# Patient Record
Sex: Male | Born: 1980 | Hispanic: No | Marital: Married | Smoking: Never smoker
Health system: Southern US, Community
[De-identification: ages and names within clinical notes are randomized; demographics above are authoritative.]

## PROBLEM LIST (undated history)

## (undated) HISTORY — PX: HERNIA REPAIR: SHX51

---

## 2022-04-13 ENCOUNTER — Emergency Department (HOSPITAL_BASED_OUTPATIENT_CLINIC_OR_DEPARTMENT_OTHER)
Admission: EM | Admit: 2022-04-13 | Discharge: 2022-04-14 | Disposition: A | Discharge status: Home / Self Care | Payer: BLUE CROSS/BLUE SHIELD | Attending: Emergency Medicine | Admitting: Emergency Medicine

## 2022-04-13 ENCOUNTER — Encounter (HOSPITAL_BASED_OUTPATIENT_CLINIC_OR_DEPARTMENT_OTHER): Payer: Self-pay | Admitting: Urology

## 2022-04-13 ENCOUNTER — Other Ambulatory Visit: Payer: Self-pay

## 2022-04-13 DIAGNOSIS — R202 Paresthesia of skin: Secondary | ICD-10-CM | POA: Diagnosis not present

## 2022-04-13 DIAGNOSIS — R1013 Epigastric pain: Secondary | ICD-10-CM | POA: Insufficient documentation

## 2022-04-13 DIAGNOSIS — G8929 Other chronic pain: Secondary | ICD-10-CM

## 2022-04-13 DIAGNOSIS — R200 Anesthesia of skin: Secondary | ICD-10-CM

## 2022-04-13 DIAGNOSIS — K219 Gastro-esophageal reflux disease without esophagitis: Secondary | ICD-10-CM | POA: Insufficient documentation

## 2022-04-13 LAB — CBG MONITORING, ED: Glucose-Capillary: 143 mg/dL — ABNORMAL HIGH (ref 70–99)

## 2022-04-13 NOTE — ED Triage Notes (Signed)
Pt states left side numbness and dizziness x 2 days, states vision blurry as well  Left sided abdominal pain radiating to back   VAN neg, BEFAST neg

## 2022-04-14 ENCOUNTER — Emergency Department (HOSPITAL_BASED_OUTPATIENT_CLINIC_OR_DEPARTMENT_OTHER): Payer: BLUE CROSS/BLUE SHIELD

## 2022-04-14 LAB — HEPATIC FUNCTION PANEL
ALT: 31 U/L (ref 0–44)
AST: 22 U/L (ref 15–41)
Albumin: 4.4 g/dL (ref 3.5–5.0)
Alkaline Phosphatase: 102 U/L (ref 38–126)
Bilirubin, Direct: <0.1 mg/dL (ref 0.0–0.2)
Total Bilirubin: 0.6 mg/dL (ref 0.3–1.2)
Total Protein: 7.7 g/dL (ref 6.5–8.1)

## 2022-04-14 LAB — BASIC METABOLIC PANEL
Anion gap: 9 (ref 5–15)
BUN: 15 mg/dL (ref 6–20)
CO2: 28 mmol/L (ref 22–32)
Calcium: 9.6 mg/dL (ref 8.9–10.3)
Chloride: 100 mmol/L (ref 98–111)
Creatinine, Ser: 0.92 mg/dL (ref 0.61–1.24)
GFR, Estimated: >60 mL/min (ref >60)
Glucose, Bld: 116 mg/dL — ABNORMAL HIGH (ref 70–99)
Potassium: 4.5 mmol/L (ref 3.5–5.1)
Sodium: 137 mmol/L (ref 135–145)

## 2022-04-14 LAB — CBC
HCT: 47.1 % (ref 39.0–52.0)
Hemoglobin: 15.7 g/dL (ref 13.0–17.0)
MCH: 27.4 pg (ref 26.0–34.0)
MCHC: 33.3 g/dL (ref 30.0–36.0)
MCV: 82.2 fL (ref 80.0–100.0)
Platelets: 222 10*3/uL (ref 150–400)
RBC: 5.73 MIL/uL (ref 4.22–5.81)
RDW: 13.1 % (ref 11.5–15.5)
WBC: 8.8 10*3/uL (ref 4.0–10.5)
nRBC: 0 % (ref 0.0–0.2)

## 2022-04-14 LAB — URINALYSIS, ROUTINE W REFLEX MICROSCOPIC
Bilirubin Urine: NEGATIVE
Glucose, UA: NEGATIVE mg/dL
Ketones, ur: NEGATIVE mg/dL
Leukocytes,Ua: NEGATIVE
Nitrite: NEGATIVE
Protein, ur: NEGATIVE mg/dL
Specific Gravity, Urine: 1.01 (ref 1.005–1.030)
pH: 6 (ref 5.0–8.0)

## 2022-04-14 LAB — URINALYSIS, MICROSCOPIC (REFLEX)

## 2022-04-14 LAB — LIPASE, BLOOD: Lipase: 34 U/L (ref 11–51)

## 2022-04-14 LAB — TROPONIN I (HIGH SENSITIVITY)
Troponin I (High Sensitivity): <2 ng/L (ref <18)
Troponin I (High Sensitivity): <2 ng/L (ref <18)

## 2022-04-14 NOTE — ED Notes (Signed)
Pt is going to [facility] for an MRI, going POV, [facility] is accepting.

## 2022-04-14 NOTE — ED Notes (Signed)
Called [facility] to see if patient has arrived at facility as they are still showing "OTF" at [facility], states patient is not there. This RN attempted to call patient, phone continues to ring, no answer. Unable to leave voicemail as phone would continue to ring and would not go to voicemail.

## 2022-04-14 NOTE — Discharge Instructions (Signed)
Please go to the [facility] emergency department.  Check in at the front desk and tell them you are here as a transfer from this facility to receive an MRI.  They may have the wait in the waiting room but will do this scan from the waiting room.

## 2022-04-14 NOTE — ED Notes (Addendum)
Discharging patient from system as they are no longer at [facility]. Unable to obtain pain score at time of discharge in chart

## 2022-04-14 NOTE — ED Notes (Signed)
Pt gone to [place] er

## 2022-04-14 NOTE — ED Provider Notes (Signed)
Emergency Department Provider Note   I have reviewed the triage vital signs and the nursing notes.   HISTORY  Chief Complaint Dizziness and Numbness   HPI Eddie Turner is a 41 y.o. male with past medical history reviewed below including ongoing epigastric abdominal pain with GERD and history of Nissen fundoplication presents emergency department with new left side numbness.  Patient states 2 days ago he developed numbness to the left face and arm.  Occasionally has numbness/tingling into the left leg.  No appreciable weakness.  No headaches or vision change.  He continues to have pain in his epigastric region which is been present for several months.  No vomiting.  He is followed in the [facility] system with plan for surgical revision later this month.  No fevers.  No new or suddenly worsening pain symptoms.  History reviewed. No pertinent past medical history.  Review of Systems  Constitutional: No fever/chills Eyes: No visual changes. ENT: No sore throat. Cardiovascular: Denies chest pain. Respiratory: Denies shortness of breath. Gastrointestinal: Positive epigastric abdominal pain.  No nausea, no vomiting.  No diarrhea.  No constipation. Genitourinary: Negative for dysuria. Musculoskeletal: Negative for back pain. Skin: Negative for rash. Neurological: Negative for headaches, focal weakness. Positive left face and arm numbness.    ____________________________________________   PHYSICAL EXAM:  VITAL SIGNS: ED Triage Vitals  Enc Vitals Group     BP 04/13/22 2337 115/76     Pulse Rate 04/13/22 2337 82     Resp 04/13/22 2337 20     Temp 04/13/22 2337 98.2 F (36.8 C)     Temp Source 04/13/22 2337 Oral     SpO2 04/13/22 2337 100 %     Weight 04/13/22 2337 207 lb (93.9 kg)     Height 04/13/22 2337 5\' 5"  (1.651 m)   Constitutional: Alert and oriented. Well appearing and in no acute distress. Eyes: Conjunctivae are normal.  Head: Atraumatic. Nose: No  congestion/rhinnorhea. Mouth/Throat: Mucous membranes are moist.   Neck: No stridor.  Cardiovascular: Normal rate, regular rhythm. Good peripheral circulation. Grossly normal heart sounds.   Respiratory: Normal respiratory effort.  No retractions. Lungs CTAB. Gastrointestinal: Soft and nontender. No distention.  Musculoskeletal: No lower extremity tenderness nor edema. No gross deformities of extremities. Neurologic:  Normal speech and language.  No appreciable facial droop or extremity weakness.  Subjective sensory decreased to the left upper extremity and left face.  Normal sensation in the lower extremities.  Skin:  Skin is warm, dry and intact. No rash noted. ____________________________________________   LABS (all labs ordered are listed, but only abnormal results are displayed)  Labs Reviewed  BASIC METABOLIC PANEL - Abnormal; Notable for the following components:      Result Value   Glucose, Bld 116 (*)    All other components within normal limits  URINALYSIS, ROUTINE W REFLEX MICROSCOPIC - Abnormal; Notable for the following components:   Hgb urine dipstick TRACE (*)    All other components within normal limits  URINALYSIS, MICROSCOPIC (REFLEX) - Abnormal; Notable for the following components:   Bacteria, UA RARE (*)    All other components within normal limits  CBG MONITORING, ED - Abnormal; Notable for the following components:   Glucose-Capillary 143 (*)    All other components within normal limits  CBC  HEPATIC FUNCTION PANEL  LIPASE, BLOOD  TROPONIN I (HIGH SENSITIVITY)  TROPONIN I (HIGH SENSITIVITY)   ____________________________________________  EKG   EKG Interpretation  Date/Time:  Wednesday Apr 13 2022  23:40:53 EDT Ventricular Rate:  78 PR Interval:  162 QRS Duration: 80 QT Interval:  364 QTC Calculation: 414 R Axis:   4 Text Interpretation: Normal sinus rhythm Nonspecific T wave abnormality Abnormal ECG No previous ECGs available Confirmed by Nanda Quinton [phone]) on 04/13/2022 11:52:54 PM        ____________________________________________  RADIOLOGY  DG Chest 2 View  Result Date: 04/14/2022 CLINICAL DATA:  Chest pain EXAM: CHEST - 2 VIEW COMPARISON:  01/26/2022 FINDINGS: The heart size and mediastinal contours are within normal limits. Both lungs are clear. The visualized skeletal structures are unremarkable. IMPRESSION: Normal study. Electronically Signed   By: Rolm Baptise M.D.   On: 04/14/2022 00:26   CT Head Wo Contrast  Result Date: 04/14/2022 CLINICAL DATA:  Left-sided numbness, dizziness, blurred vision EXAM: CT HEAD WITHOUT CONTRAST TECHNIQUE: Contiguous axial images were obtained from the base of the skull through the vertex without intravenous contrast. RADIATION DOSE REDUCTION: This exam was performed according to the departmental dose-optimization program which includes automated exposure control, adjustment of the mA and/or kV according to patient size and/or use of iterative reconstruction technique. COMPARISON:  None Available. FINDINGS: Brain: No evidence of acute infarction, hemorrhage, hydrocephalus, extra-axial collection or mass lesion/mass effect. Vascular: No hyperdense vessel or unexpected calcification. Skull: Normal. Negative for fracture or focal lesion. Sinuses/Orbits: The visualized paranasal sinuses are essentially clear. The mastoid air cells are unopacified. Other: None. IMPRESSION: Normal head CT. Electronically Signed   By: Julian Hy M.D.   On: 04/14/2022 00:18    ____________________________________________   PROCEDURES  Procedure(s) performed:   Procedures  None  ____________________________________________   INITIAL IMPRESSION / ASSESSMENT AND PLAN / ED COURSE  Pertinent labs & imaging results that were available during my care of the patient were reviewed by me and considered in my medical decision making (see chart for details).   This patient is Presenting for Evaluation of  numbness, which does require a range of treatment options, and is a complaint that involves a high risk of morbidity and mortality.  The Differential Diagnoses include CVA, TIA, electrolyte disturbance, neuropathy.   I decided to review pertinent External Data, and in summary patient followed with the [facility] system for abdominal pain with last CT abdomen/pelvis on 5/11.   Clinical Laboratory Tests Ordered, included troponin is within normal limits.  No leukocytosis.  LFTs and bilirubin are normal.  No leukocytosis.  Lipase negative.  Radiologic Tests Ordered, included CXR and CT head. I independently interpreted the images and agree with radiology interpretation.   Cardiac Monitor Tracing which shows NSR.   Social Determinants of Health Risk no smoking history.    Medical Decision Making: Summary:  Patient presents to the emergency department for evaluation of ongoing abdominal pain which is not new or different.  Had a recent CT scan in the [facility] system and so we will defer work-up further.  His abdominal exam here for me is reassuring and vital signs are within normal limits.  I am concerned regarding the 2 days of numbness to the left face and arm.  No neck or back pain.  No headache to suspect complex migraine.  CT imaging of the head is reassuring.  Discussed transfer to [facility] for MRI this evening and patient is in agreement.  Dr. Matilde Sprang is accepting.   Reevaluation with update and discussion with patient in agreement with plan for transfer.    Disposition: transfer to [facility].   ____________________________________________  FINAL CLINICAL  IMPRESSION(S) / ED DIAGNOSES  Final diagnoses:  Numbness  Chronic abdominal pain     Note:  This document was prepared using Dragon voice recognition software and may include unintentional dictation errors.  Nanda Quinton, MD, [facility] Emergency Medicine    Leland Staszewski, [facility], MD 04/14/22 [phone]

## 2023-07-02 IMAGING — DX DG CHEST 2V
2 series · 2 of 2 positions shown · non-contrast
Comparison: 01/26/2022

CLINICAL DATA: Chest pain

EXAM:
CHEST - 2 VIEW

[chest lat]
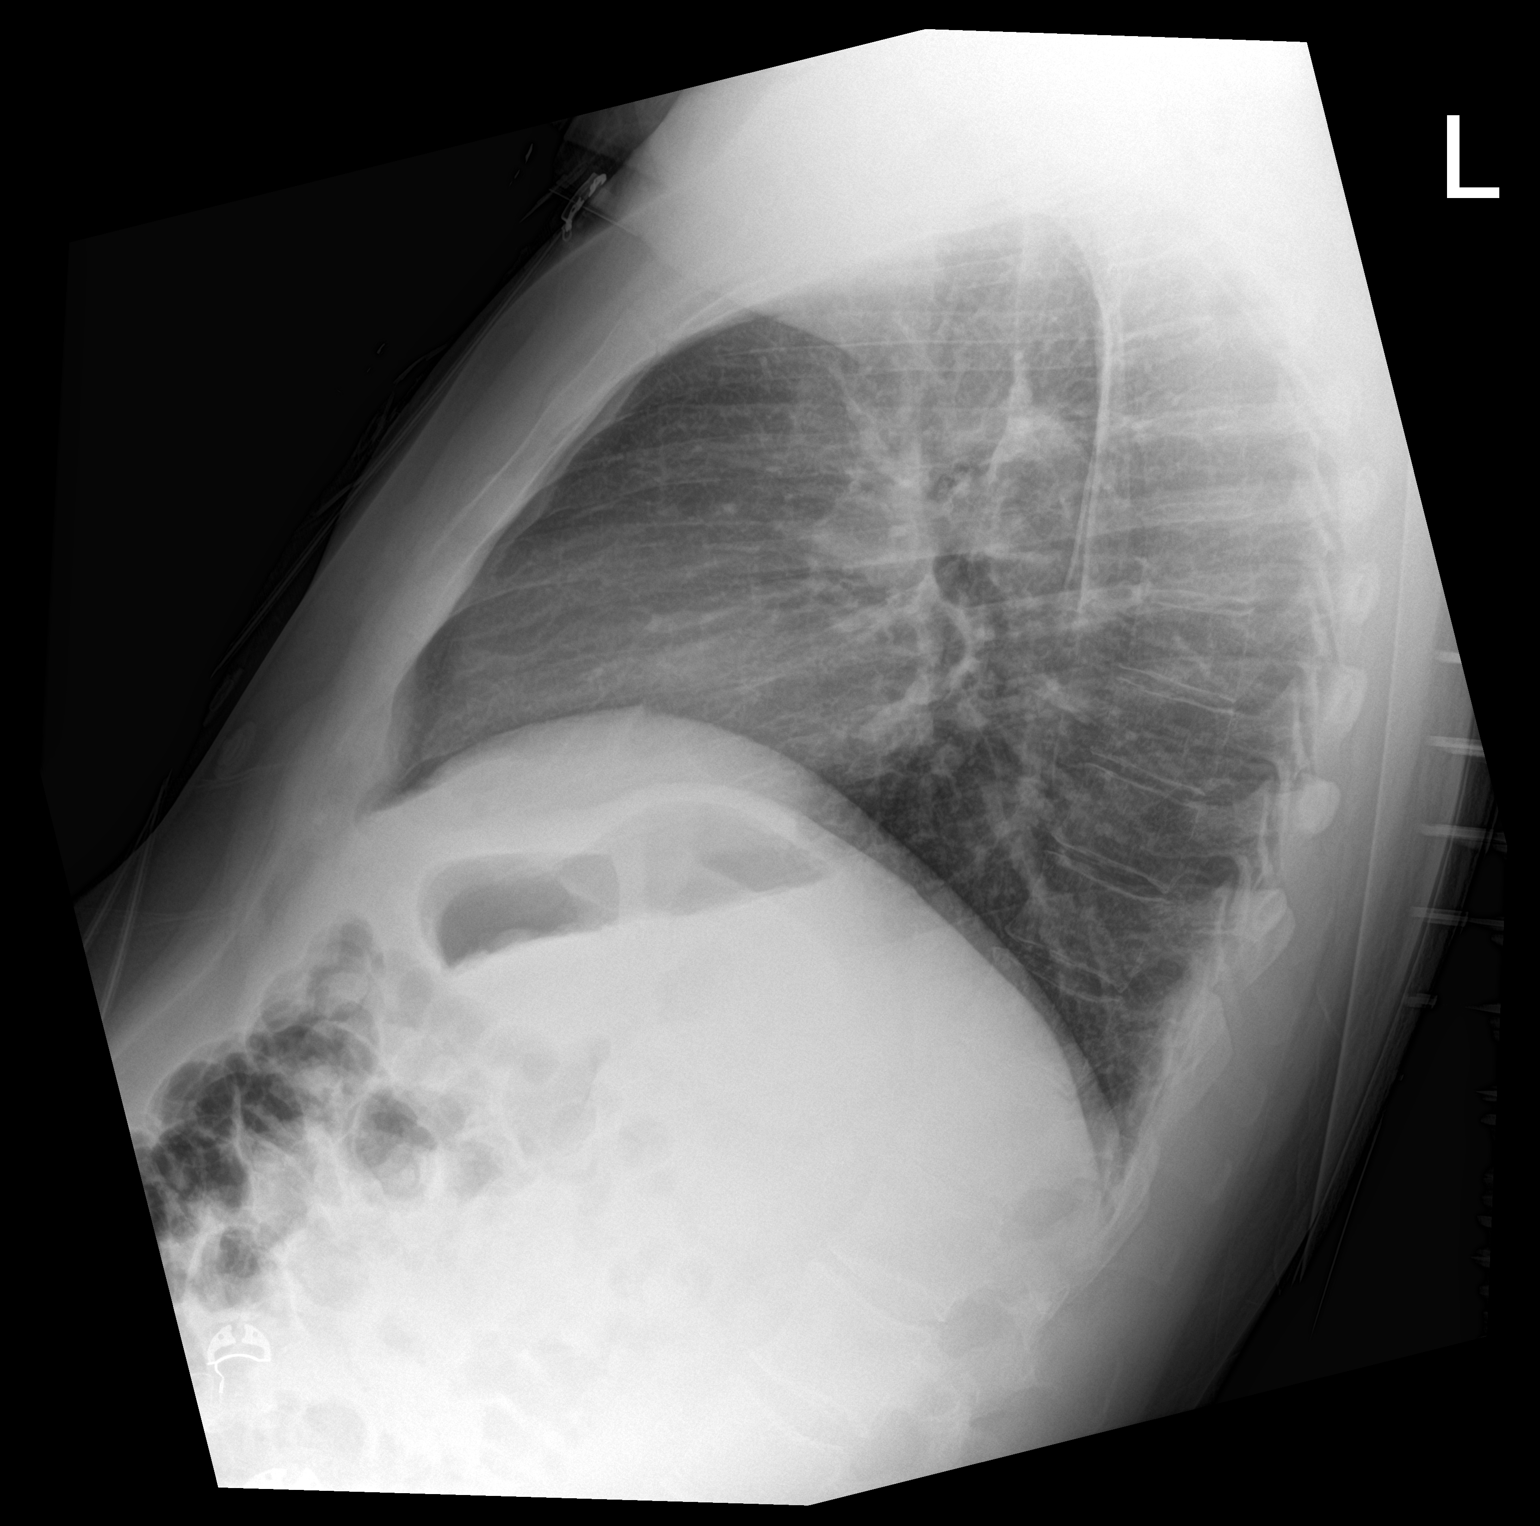

[chest ap strecther]
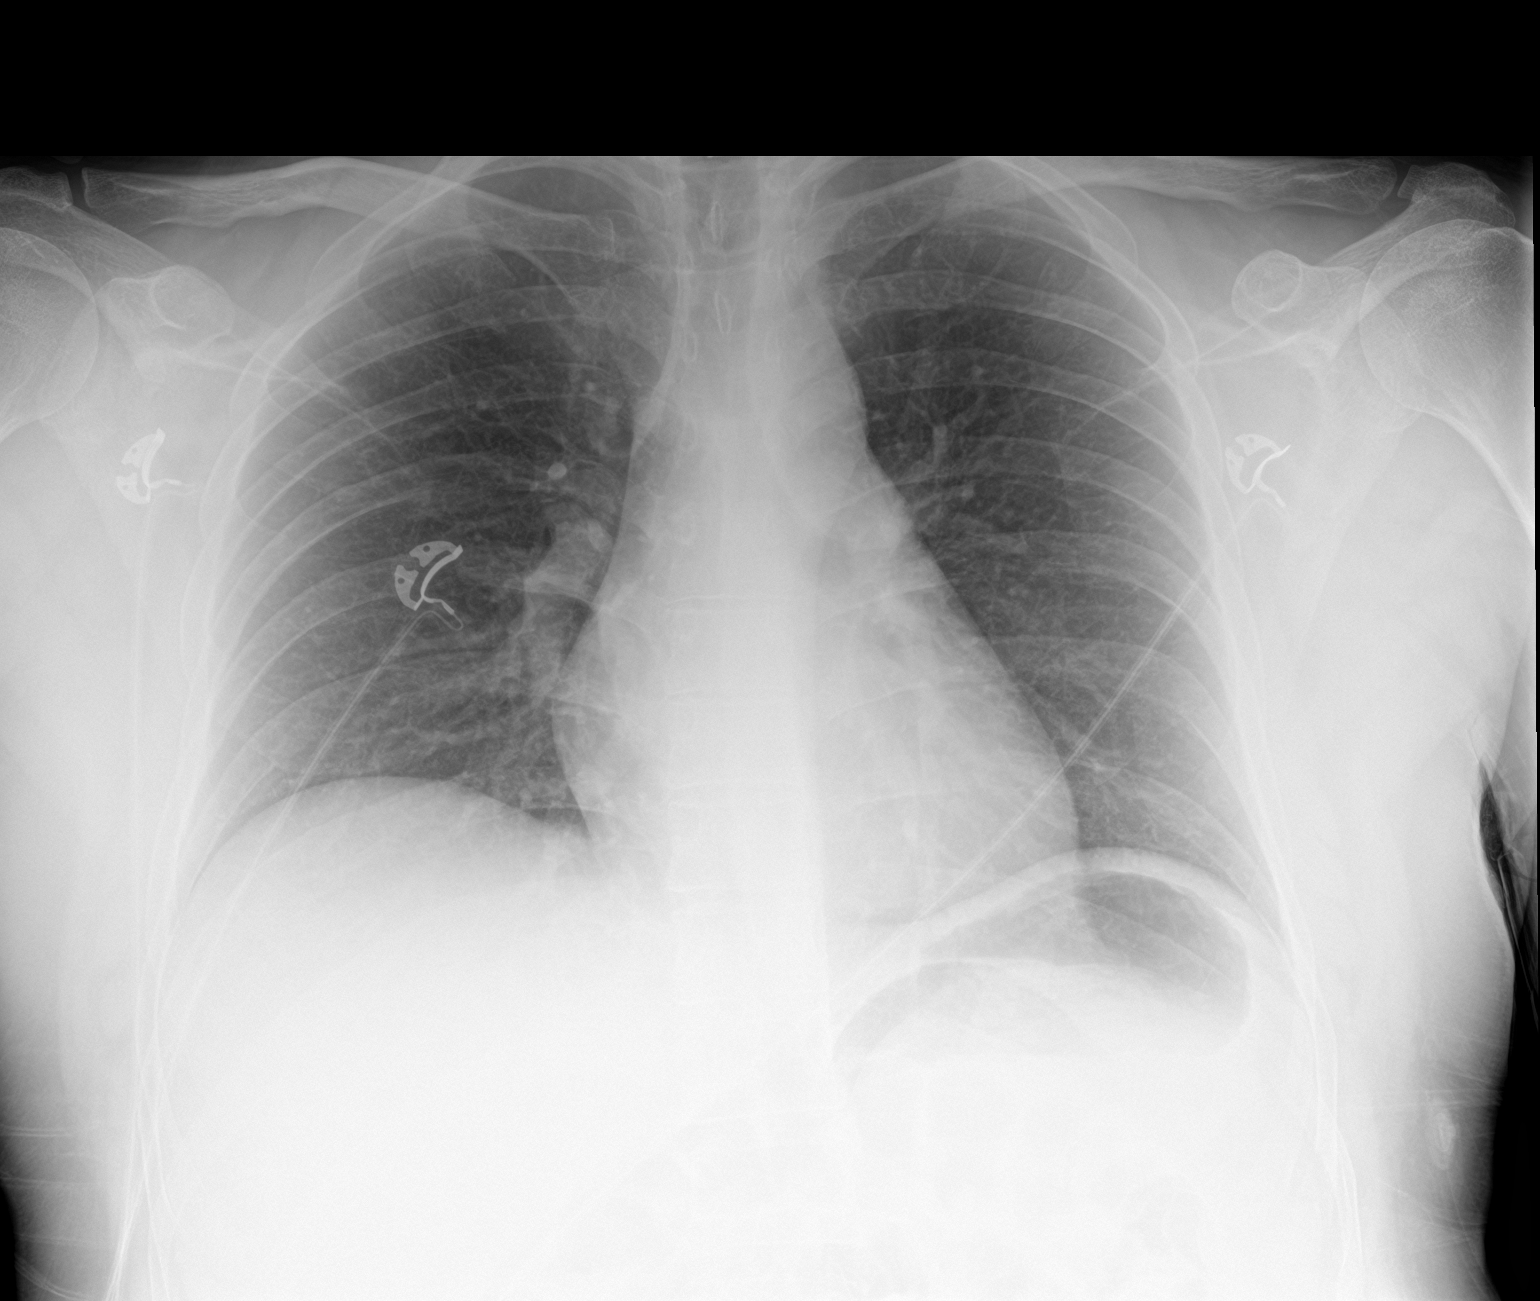

[2 of 2 positions shown; findings below may reference images not displayed]

FINDINGS: The heart size and mediastinal contours are within normal limits.
Both lungs are clear. The visualized skeletal structures are
unremarkable.
IMPRESSION: Normal study.

## 2024-10-04 ENCOUNTER — Emergency Department (HOSPITAL_BASED_OUTPATIENT_CLINIC_OR_DEPARTMENT_OTHER)

## 2024-10-04 ENCOUNTER — Other Ambulatory Visit: Payer: Self-pay

## 2024-10-04 ENCOUNTER — Emergency Department (HOSPITAL_BASED_OUTPATIENT_CLINIC_OR_DEPARTMENT_OTHER)
Admission: EM | Admit: 2024-10-04 | Discharge: 2024-10-04 | Disposition: A | Discharge status: Home / Self Care | Attending: Emergency Medicine | Admitting: Emergency Medicine

## 2024-10-04 ENCOUNTER — Encounter (HOSPITAL_BASED_OUTPATIENT_CLINIC_OR_DEPARTMENT_OTHER): Payer: Self-pay

## 2024-10-04 DIAGNOSIS — R197 Diarrhea, unspecified: Secondary | ICD-10-CM | POA: Diagnosis not present

## 2024-10-04 DIAGNOSIS — R079 Chest pain, unspecified: Secondary | ICD-10-CM | POA: Insufficient documentation

## 2024-10-04 DIAGNOSIS — R051 Acute cough: Secondary | ICD-10-CM | POA: Diagnosis not present

## 2024-10-04 DIAGNOSIS — J392 Other diseases of pharynx: Secondary | ICD-10-CM | POA: Diagnosis not present

## 2024-10-04 DIAGNOSIS — R0600 Dyspnea, unspecified: Secondary | ICD-10-CM | POA: Diagnosis not present

## 2024-10-04 DIAGNOSIS — R059 Cough, unspecified: Secondary | ICD-10-CM | POA: Diagnosis present

## 2024-10-04 LAB — TROPONIN T, HIGH SENSITIVITY
Troponin T High Sensitivity: <15 ng/L (ref 0–19)
Troponin T High Sensitivity: <15 ng/L (ref 0–19)

## 2024-10-04 LAB — HEPATIC FUNCTION PANEL
ALT: 36 U/L (ref 0–44)
AST: 25 U/L (ref 15–41)
Albumin: 4.3 g/dL (ref 3.5–5.0)
Alkaline Phosphatase: 142 U/L — ABNORMAL HIGH (ref 38–126)
Bilirubin, Direct: 0.2 mg/dL (ref 0.0–0.2)
Indirect Bilirubin: 0.1 mg/dL — ABNORMAL LOW (ref 0.3–0.9)
Total Bilirubin: 0.3 mg/dL (ref 0.0–1.2)
Total Protein: 7 g/dL (ref 6.5–8.1)

## 2024-10-04 LAB — BASIC METABOLIC PANEL WITH GFR
Anion gap: 12 (ref 5–15)
BUN: 8 mg/dL (ref 6–20)
CO2: 25 mmol/L (ref 22–32)
Calcium: 9.5 mg/dL (ref 8.9–10.3)
Chloride: 101 mmol/L (ref 98–111)
Creatinine, Ser: 0.79 mg/dL (ref 0.61–1.24)
GFR, Estimated: >60 mL/min (ref >60)
Glucose, Bld: 166 mg/dL — ABNORMAL HIGH (ref 70–99)
Potassium: 4.2 mmol/L (ref 3.5–5.1)
Sodium: 138 mmol/L (ref 135–145)

## 2024-10-04 LAB — CBC
HCT: 44.6 % (ref 39.0–52.0)
Hemoglobin: 14.6 g/dL (ref 13.0–17.0)
MCH: 25.8 pg — ABNORMAL LOW (ref 26.0–34.0)
MCHC: 32.7 g/dL (ref 30.0–36.0)
MCV: 78.8 fL — ABNORMAL LOW (ref 80.0–100.0)
Platelets: 286 K/uL (ref 150–400)
RBC: 5.66 MIL/uL (ref 4.22–5.81)
RDW: 14.1 % (ref 11.5–15.5)
WBC: 9 K/uL (ref 4.0–10.5)
nRBC: 0 % (ref 0.0–0.2)

## 2024-10-04 LAB — GROUP A STREP BY PCR: Group A Strep by PCR: NOT DETECTED

## 2024-10-04 LAB — RESP PANEL BY RT-PCR (RSV, FLU A&B, COVID)  RVPGX2
Influenza A by PCR: NEGATIVE
Influenza B by PCR: NEGATIVE
Resp Syncytial Virus by PCR: NEGATIVE
SARS Coronavirus 2 by RT PCR: NEGATIVE

## 2024-10-04 LAB — LIPASE, BLOOD: Lipase: 29 U/L (ref 11–51)

## 2024-10-04 MED ORDER — IOHEXOL 350 MG/ML SOLN
100.0000 mL | Freq: Once | INTRAVENOUS | Status: AC | PRN
  Administered 2024-10-04: 100 mL via INTRAVENOUS

## 2024-10-04 MED ORDER — PANTOPRAZOLE SODIUM 20 MG PO TBEC: 40.0000 mg | DELAYED_RELEASE_TABLET | Freq: Every day | ORAL | 0 refills | Status: AC

## 2024-10-04 NOTE — ED Provider Notes (Signed)
 [facility] Provider Note   CSN: [id number] Arrival date & time: 10/04/24  8158     Patient presents with: Cough, Chest Pain, and Diarrhea   Eddie Turner is a 43 y.o. male.  {Add pertinent medical, surgical, social history, OB history to HPI:32947} HPI     Cough worsening over last 3 weeks Feels like burning iwht breathing in chest Pressure in chest If eat or drink anything it hurts in throat, feels hot, warm, throbbing 2 days ago started having diarrhea Body feels hot from inside, a little diaphoresis No nausea or vomiting A little shortness of breath now Feels like breathing fire 2 days Can't tell if fever but feels hot for a minute comes and toes, sweating, like  No smoking, etoh or other drugs  History reviewed. No pertinent past medical history.  Past Surgical History:  Procedure Laterality Date   HERNIA REPAIR      Prior to Admission medications   Not on File    Allergies: Patient has no known allergies.    Review of Systems  Updated Vital Signs BP (!) 177/86   Pulse 100   Temp 99 F (37.2 C) (Oral)   Resp 15   Ht 5' 6 (1.676 m)   Wt 108.9 kg   SpO2 98%   BMI 38.74 kg/m   Physical Exam  (all labs ordered are listed, but only abnormal results are displayed) Labs Reviewed  RESP PANEL BY RT-PCR (RSV, FLU A&B, COVID)  [id number]  BASIC METABOLIC PANEL WITH GFR  CBC  HEPATIC FUNCTION PANEL  LIPASE, BLOOD  TROPONIN T, HIGH SENSITIVITY    EKG: EKG Interpretation Date/Time:  Friday October 04 2024 18:53:05 EST Ventricular Rate:  107 PR Interval:  132 QRS Duration:  82 QT Interval:  319 QTC Calculation: 426 R Axis:   -4  Text Interpretation: Sinus tachycardia Nonspecific T abnormalities, inferior leads Minimal ST elevation, inferior leads Baseline wander in lead(s) V4 Similar ST abnormalities to previous ECG , rate increased Confirmed by Dreama Longs ([id number]) on 10/04/2024 6:56:59  PM  Radiology: [facility] Chest 2 View Result Date: 10/04/2024 EXAM: 2 VIEW(S) XRAY OF THE CHEST 10/04/2024 07:31:00 PM COMPARISON: 04/14/2022 CLINICAL HISTORY: cough cough FINDINGS: LUNGS AND PLEURA: No focal pulmonary opacity. No pleural effusion. No pneumothorax. HEART AND MEDIASTINUM: No acute abnormality of the cardiac and mediastinal silhouettes. BONES AND SOFT TISSUES: No acute osseous abnormality. IMPRESSION: 1. No acute cardiopulmonary process. Electronically signed by: Franky Crease MD 10/04/2024 07:40 PM EST RP Workstation: [id number]    {Document cardiac monitor, telemetry assessment procedure when appropriate:32947} Procedures   Medications Ordered in the ED - No data to display    {Click here for ABCD2, HEART and other calculators REFRESH Note before signing:1}                              Medical Decision Making Amount and/or Complexity of Data Reviewed Labs: ordered. Radiology: ordered.   ***  {Document critical care time when appropriate  Document review of labs and clinical decision tools ie [id number], etc  Document your independent review of radiology images and any outside records  Document your discussion with family members, caretakers and with consultants  Document social determinants of health affecting pt's care  Document your decision making why or why not admission, treatments were needed:32947:::1}   Final diagnoses:  None    ED Discharge Orders     None

## 2024-10-04 NOTE — ED Triage Notes (Signed)
 Pt reports 3 weeks of cough and 1 day of burning chest pain. Diarrhea since 2 days ago.
